# Patient Record
Sex: Male | Born: 1965
Health system: Southern US, Community
[De-identification: ages and names within clinical notes are randomized; demographics above are authoritative.]

---

## 2007-10-25 ENCOUNTER — Ambulatory Visit: Payer: Self-pay | Admitting: Internal Medicine

## 2007-11-20 ENCOUNTER — Ambulatory Visit: Payer: Self-pay | Admitting: Internal Medicine

## 2008-05-04 ENCOUNTER — Ambulatory Visit: Payer: Self-pay | Admitting: Family Medicine

## 2009-05-22 ENCOUNTER — Other Ambulatory Visit: Payer: Self-pay | Admitting: Internal Medicine

## 2010-04-28 ENCOUNTER — Ambulatory Visit: Payer: Self-pay | Admitting: Internal Medicine

## 2010-10-19 ENCOUNTER — Ambulatory Visit
Admission: RE | Admit: 2010-10-19 | Discharge: 2010-10-19 | Disposition: A | Discharge status: Home / Self Care | Payer: BC Managed Care – PPO | Source: Ambulatory Visit | Attending: Internal Medicine | Admitting: Internal Medicine

## 2010-10-19 ENCOUNTER — Other Ambulatory Visit: Payer: Self-pay | Admitting: Internal Medicine

## 2010-10-19 DIAGNOSIS — M542 Cervicalgia: Secondary | ICD-10-CM

## 2010-10-19 DIAGNOSIS — M25519 Pain in unspecified shoulder: Secondary | ICD-10-CM

## 2012-06-14 IMAGING — CR DG SHOULDER 2+V*R*
3 series · 3 of 3 positions shown · non-contrast
Comparison: None.

CLINICAL DATA: Chronic right shoulder pain.  No known injuries.

RIGHT SHOULDER - 2+ VIEW 10/19/2010:

[view not recorded (1 of 3)]
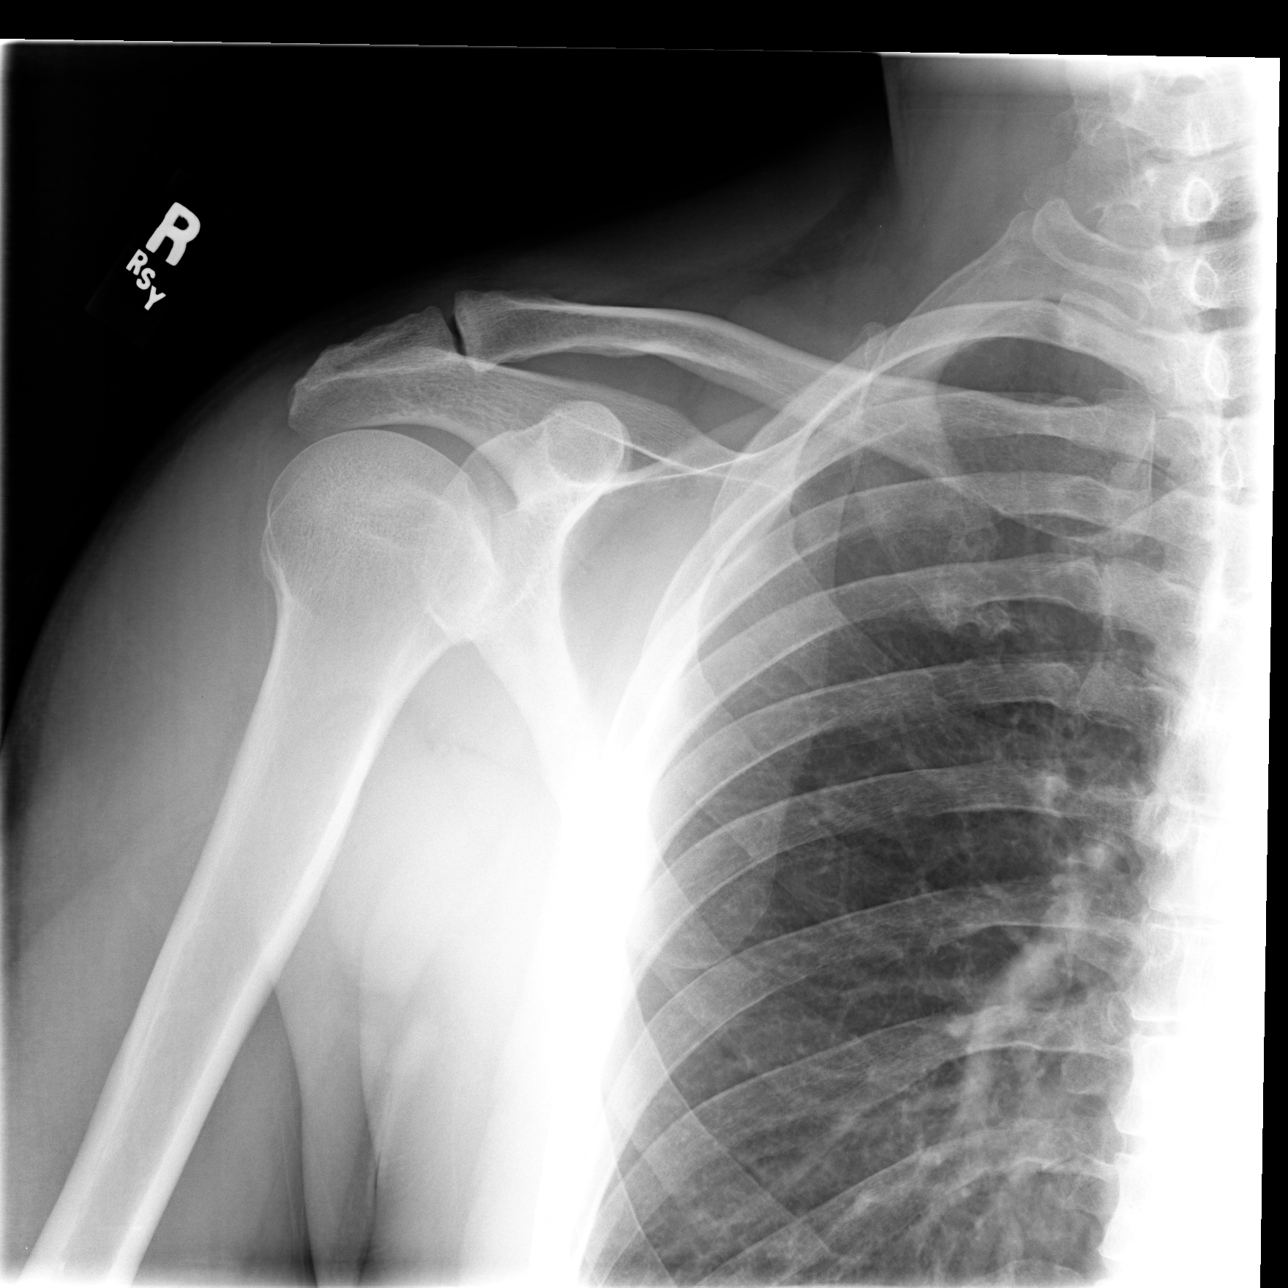

[view not recorded (2 of 3)]
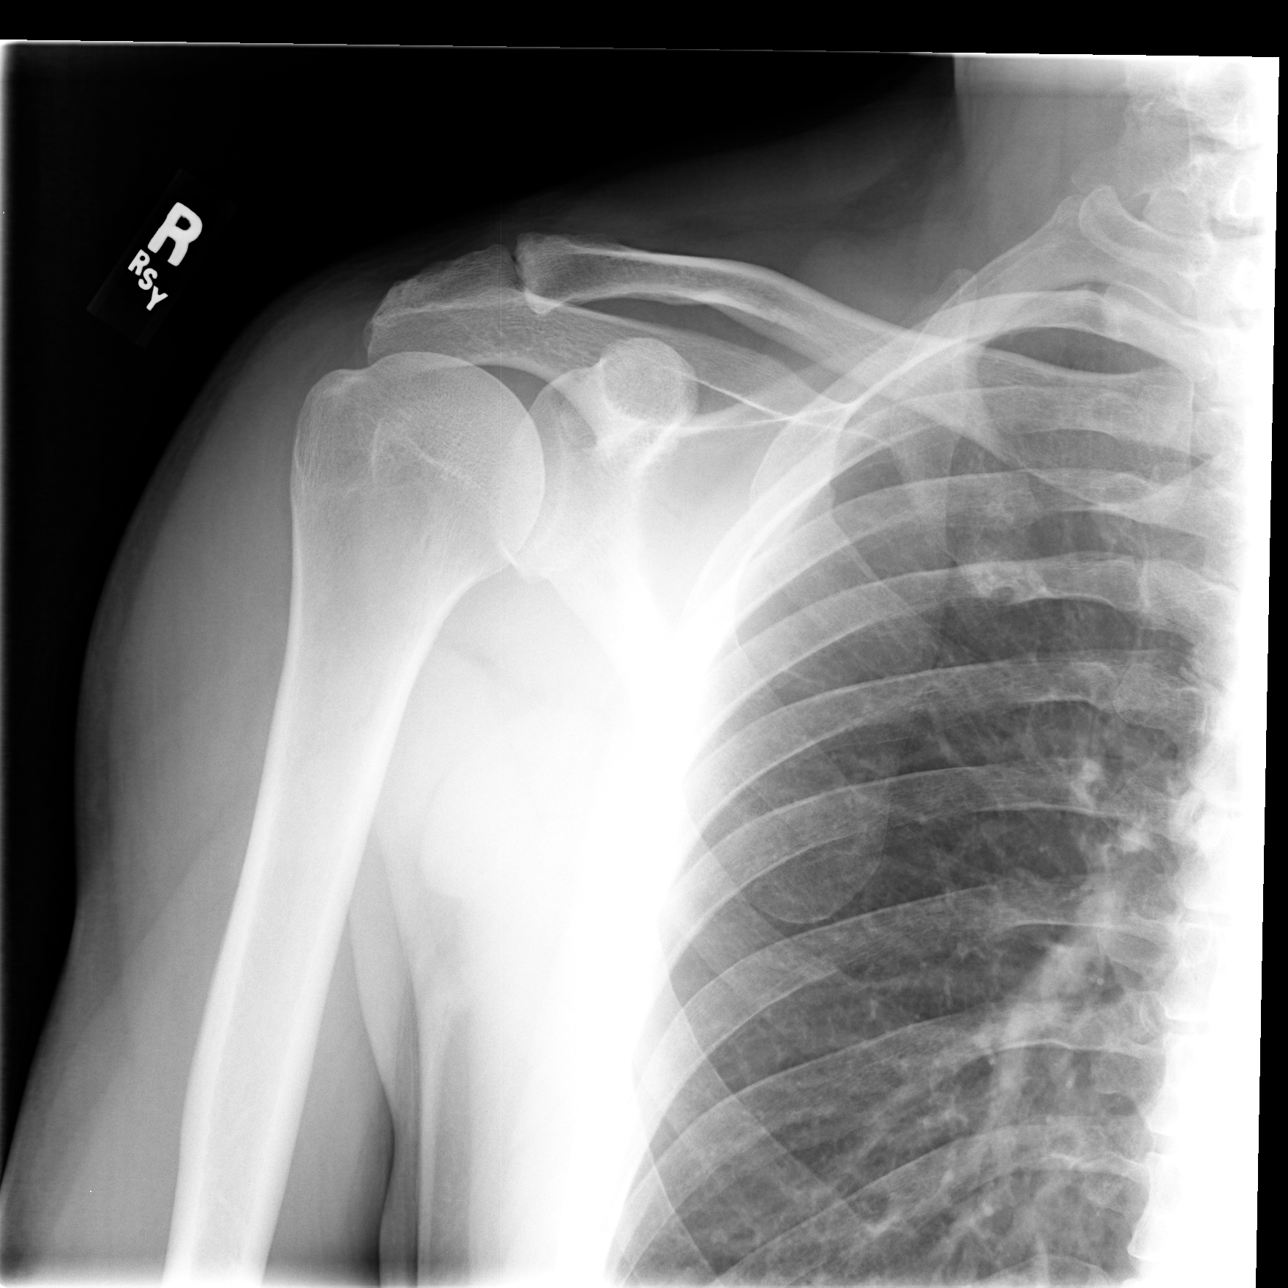

[view not recorded (3 of 3)]
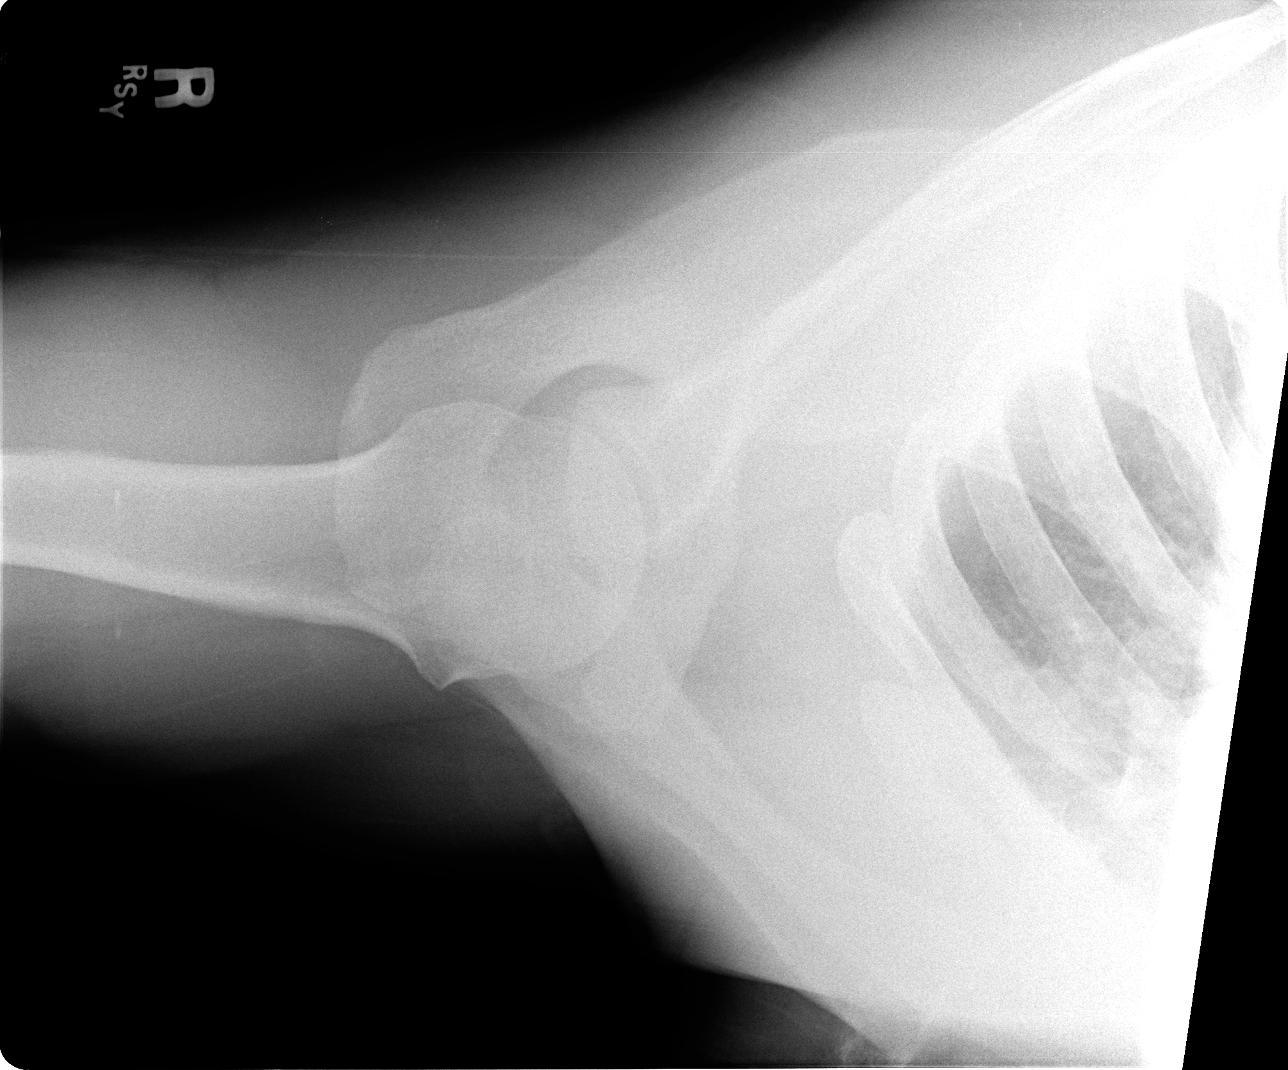

[3 of 3 positions shown; findings below may reference images not displayed]

FINDINGS: No evidence of acute, subacute, or healed fractures.
Glenohumeral joint intact.  Subacromial space well preserved.  Mild
degenerative changes in the acromioclavicular joint.  Well-
preserved bone mineral density.  No intrinsic osseous
abnormalities.
IMPRESSION: Mild degenerative changes in the acromioclavicular joint.
Otherwise normal examination.

## 2012-10-01 ENCOUNTER — Ambulatory Visit: Payer: Self-pay

## 2016-03-07 DIAGNOSIS — T387X5A Adverse effect of androgens and anabolic congeners, initial encounter: Secondary | ICD-10-CM | POA: Diagnosis not present

## 2016-03-07 DIAGNOSIS — N401 Enlarged prostate with lower urinary tract symptoms: Secondary | ICD-10-CM | POA: Diagnosis not present

## 2016-03-07 DIAGNOSIS — N138 Other obstructive and reflux uropathy: Secondary | ICD-10-CM | POA: Diagnosis not present

## 2016-03-07 DIAGNOSIS — R358 Other polyuria: Secondary | ICD-10-CM | POA: Diagnosis not present

## 2016-03-07 DIAGNOSIS — E291 Testicular hypofunction: Secondary | ICD-10-CM | POA: Diagnosis not present

## 2016-03-08 DIAGNOSIS — E291 Testicular hypofunction: Secondary | ICD-10-CM | POA: Diagnosis not present

## 2016-03-14 DIAGNOSIS — E291 Testicular hypofunction: Secondary | ICD-10-CM | POA: Diagnosis not present

## 2016-03-31 DIAGNOSIS — E291 Testicular hypofunction: Secondary | ICD-10-CM | POA: Diagnosis not present

## 2016-06-14 DIAGNOSIS — E291 Testicular hypofunction: Secondary | ICD-10-CM | POA: Diagnosis not present

## 2016-06-14 DIAGNOSIS — R358 Other polyuria: Secondary | ICD-10-CM | POA: Diagnosis not present

## 2016-07-26 DIAGNOSIS — R358 Other polyuria: Secondary | ICD-10-CM | POA: Diagnosis not present

## 2016-07-26 DIAGNOSIS — N401 Enlarged prostate with lower urinary tract symptoms: Secondary | ICD-10-CM | POA: Diagnosis not present

## 2016-07-26 DIAGNOSIS — E291 Testicular hypofunction: Secondary | ICD-10-CM | POA: Diagnosis not present

## 2016-07-26 DIAGNOSIS — T387X5D Adverse effect of androgens and anabolic congeners, subsequent encounter: Secondary | ICD-10-CM | POA: Diagnosis not present

## 2016-07-26 DIAGNOSIS — N138 Other obstructive and reflux uropathy: Secondary | ICD-10-CM | POA: Diagnosis not present

## 2016-10-14 DIAGNOSIS — M25561 Pain in right knee: Secondary | ICD-10-CM | POA: Diagnosis not present

## 2016-10-14 DIAGNOSIS — S8991XA Unspecified injury of right lower leg, initial encounter: Secondary | ICD-10-CM | POA: Diagnosis not present

## 2016-10-14 DIAGNOSIS — Z87891 Personal history of nicotine dependence: Secondary | ICD-10-CM | POA: Diagnosis not present

## 2016-10-14 DIAGNOSIS — R269 Unspecified abnormalities of gait and mobility: Secondary | ICD-10-CM | POA: Diagnosis not present

## 2016-10-14 DIAGNOSIS — X501XXA Overexertion from prolonged static or awkward postures, initial encounter: Secondary | ICD-10-CM | POA: Diagnosis not present

## 2016-10-14 DIAGNOSIS — Z6830 Body mass index (BMI) 30.0-30.9, adult: Secondary | ICD-10-CM | POA: Diagnosis not present

## 2016-10-17 DIAGNOSIS — S83241A Other tear of medial meniscus, current injury, right knee, initial encounter: Secondary | ICD-10-CM | POA: Diagnosis not present

## 2016-10-17 DIAGNOSIS — Z6830 Body mass index (BMI) 30.0-30.9, adult: Secondary | ICD-10-CM | POA: Diagnosis not present

## 2016-10-27 DIAGNOSIS — N138 Other obstructive and reflux uropathy: Secondary | ICD-10-CM | POA: Diagnosis not present

## 2016-10-27 DIAGNOSIS — N401 Enlarged prostate with lower urinary tract symptoms: Secondary | ICD-10-CM | POA: Diagnosis not present

## 2017-01-26 DIAGNOSIS — N486 Induration penis plastica: Secondary | ICD-10-CM | POA: Diagnosis not present

## 2017-01-26 DIAGNOSIS — E291 Testicular hypofunction: Secondary | ICD-10-CM | POA: Diagnosis not present

## 2017-01-26 DIAGNOSIS — Z1331 Encounter for screening for depression: Secondary | ICD-10-CM | POA: Diagnosis not present

## 2017-01-26 DIAGNOSIS — Z0000 Encounter for general adult medical examination without abnormal findings: Secondary | ICD-10-CM | POA: Diagnosis not present

## 2017-01-27 DIAGNOSIS — E291 Testicular hypofunction: Secondary | ICD-10-CM | POA: Diagnosis not present

## 2017-01-27 DIAGNOSIS — Z0000 Encounter for general adult medical examination without abnormal findings: Secondary | ICD-10-CM | POA: Diagnosis not present

## 2017-02-09 DIAGNOSIS — E875 Hyperkalemia: Secondary | ICD-10-CM | POA: Diagnosis not present

## 2017-02-09 DIAGNOSIS — M67912 Unspecified disorder of synovium and tendon, left shoulder: Secondary | ICD-10-CM | POA: Diagnosis not present

## 2017-02-09 DIAGNOSIS — Z6829 Body mass index (BMI) 29.0-29.9, adult: Secondary | ICD-10-CM | POA: Diagnosis not present

## 2017-02-22 DIAGNOSIS — M542 Cervicalgia: Secondary | ICD-10-CM | POA: Diagnosis not present

## 2017-02-22 DIAGNOSIS — G5602 Carpal tunnel syndrome, left upper limb: Secondary | ICD-10-CM | POA: Diagnosis not present

## 2017-03-01 DIAGNOSIS — G5602 Carpal tunnel syndrome, left upper limb: Secondary | ICD-10-CM | POA: Diagnosis not present

## 2017-03-02 DIAGNOSIS — N486 Induration penis plastica: Secondary | ICD-10-CM | POA: Diagnosis not present

## 2017-03-02 DIAGNOSIS — N4889 Other specified disorders of penis: Secondary | ICD-10-CM | POA: Diagnosis not present

## 2017-03-08 DIAGNOSIS — G5602 Carpal tunnel syndrome, left upper limb: Secondary | ICD-10-CM | POA: Diagnosis not present

## 2017-05-01 DIAGNOSIS — Z6829 Body mass index (BMI) 29.0-29.9, adult: Secondary | ICD-10-CM | POA: Diagnosis not present

## 2017-05-01 DIAGNOSIS — R030 Elevated blood-pressure reading, without diagnosis of hypertension: Secondary | ICD-10-CM | POA: Diagnosis not present

## 2017-05-01 DIAGNOSIS — G5602 Carpal tunnel syndrome, left upper limb: Secondary | ICD-10-CM | POA: Diagnosis not present

## 2017-05-01 DIAGNOSIS — E291 Testicular hypofunction: Secondary | ICD-10-CM | POA: Diagnosis not present

## 2017-08-07 DIAGNOSIS — Z1211 Encounter for screening for malignant neoplasm of colon: Secondary | ICD-10-CM | POA: Diagnosis not present

## 2017-08-07 DIAGNOSIS — G4733 Obstructive sleep apnea (adult) (pediatric): Secondary | ICD-10-CM | POA: Diagnosis not present

## 2017-08-07 DIAGNOSIS — Z6829 Body mass index (BMI) 29.0-29.9, adult: Secondary | ICD-10-CM | POA: Diagnosis not present

## 2017-08-07 DIAGNOSIS — I10 Essential (primary) hypertension: Secondary | ICD-10-CM | POA: Diagnosis not present

## 2017-09-08 DIAGNOSIS — E291 Testicular hypofunction: Secondary | ICD-10-CM | POA: Diagnosis not present

## 2017-09-08 DIAGNOSIS — I10 Essential (primary) hypertension: Secondary | ICD-10-CM | POA: Diagnosis not present

## 2017-09-19 DIAGNOSIS — E291 Testicular hypofunction: Secondary | ICD-10-CM | POA: Diagnosis not present

## 2017-09-19 DIAGNOSIS — Z6829 Body mass index (BMI) 29.0-29.9, adult: Secondary | ICD-10-CM | POA: Diagnosis not present

## 2017-09-19 DIAGNOSIS — I10 Essential (primary) hypertension: Secondary | ICD-10-CM | POA: Diagnosis not present

## 2017-10-04 DIAGNOSIS — Z6830 Body mass index (BMI) 30.0-30.9, adult: Secondary | ICD-10-CM | POA: Diagnosis not present

## 2017-10-04 DIAGNOSIS — Z87891 Personal history of nicotine dependence: Secondary | ICD-10-CM | POA: Diagnosis not present

## 2017-10-04 DIAGNOSIS — N50819 Testicular pain, unspecified: Secondary | ICD-10-CM | POA: Diagnosis not present

## 2017-10-04 DIAGNOSIS — R1031 Right lower quadrant pain: Secondary | ICD-10-CM | POA: Diagnosis not present

## 2017-10-04 DIAGNOSIS — N50811 Right testicular pain: Secondary | ICD-10-CM | POA: Diagnosis not present

## 2017-10-04 DIAGNOSIS — I700 Atherosclerosis of aorta: Secondary | ICD-10-CM | POA: Diagnosis not present

## 2017-10-04 DIAGNOSIS — M47816 Spondylosis without myelopathy or radiculopathy, lumbar region: Secondary | ICD-10-CM | POA: Diagnosis not present

## 2017-11-03 DIAGNOSIS — Z1211 Encounter for screening for malignant neoplasm of colon: Secondary | ICD-10-CM | POA: Diagnosis not present

## 2017-11-06 DIAGNOSIS — Z6829 Body mass index (BMI) 29.0-29.9, adult: Secondary | ICD-10-CM | POA: Diagnosis not present

## 2017-11-06 DIAGNOSIS — R1031 Right lower quadrant pain: Secondary | ICD-10-CM | POA: Diagnosis not present

## 2017-12-19 DIAGNOSIS — E291 Testicular hypofunction: Secondary | ICD-10-CM | POA: Diagnosis not present

## 2017-12-19 DIAGNOSIS — I10 Essential (primary) hypertension: Secondary | ICD-10-CM | POA: Diagnosis not present

## 2017-12-19 DIAGNOSIS — Z79899 Other long term (current) drug therapy: Secondary | ICD-10-CM | POA: Diagnosis not present

## 2017-12-19 DIAGNOSIS — Z125 Encounter for screening for malignant neoplasm of prostate: Secondary | ICD-10-CM | POA: Diagnosis not present

## 2017-12-26 DIAGNOSIS — Z6830 Body mass index (BMI) 30.0-30.9, adult: Secondary | ICD-10-CM | POA: Diagnosis not present

## 2017-12-26 DIAGNOSIS — E291 Testicular hypofunction: Secondary | ICD-10-CM | POA: Diagnosis not present

## 2017-12-26 DIAGNOSIS — I10 Essential (primary) hypertension: Secondary | ICD-10-CM | POA: Diagnosis not present

## 2018-04-03 DIAGNOSIS — E291 Testicular hypofunction: Secondary | ICD-10-CM | POA: Diagnosis not present

## 2018-04-06 DIAGNOSIS — E291 Testicular hypofunction: Secondary | ICD-10-CM | POA: Diagnosis not present

## 2018-04-06 DIAGNOSIS — Z6829 Body mass index (BMI) 29.0-29.9, adult: Secondary | ICD-10-CM | POA: Diagnosis not present

## 2018-08-13 DIAGNOSIS — E291 Testicular hypofunction: Secondary | ICD-10-CM | POA: Diagnosis not present

## 2018-08-15 DIAGNOSIS — Z6830 Body mass index (BMI) 30.0-30.9, adult: Secondary | ICD-10-CM | POA: Diagnosis not present

## 2018-08-15 DIAGNOSIS — Z1331 Encounter for screening for depression: Secondary | ICD-10-CM | POA: Diagnosis not present

## 2018-08-15 DIAGNOSIS — E291 Testicular hypofunction: Secondary | ICD-10-CM | POA: Diagnosis not present

## 2018-08-15 DIAGNOSIS — I10 Essential (primary) hypertension: Secondary | ICD-10-CM | POA: Diagnosis not present

## 2018-12-17 DIAGNOSIS — Z6828 Body mass index (BMI) 28.0-28.9, adult: Secondary | ICD-10-CM | POA: Diagnosis not present

## 2018-12-17 DIAGNOSIS — Z0000 Encounter for general adult medical examination without abnormal findings: Secondary | ICD-10-CM | POA: Diagnosis not present

## 2018-12-17 DIAGNOSIS — E291 Testicular hypofunction: Secondary | ICD-10-CM | POA: Diagnosis not present

## 2018-12-17 DIAGNOSIS — Z125 Encounter for screening for malignant neoplasm of prostate: Secondary | ICD-10-CM | POA: Diagnosis not present

## 2019-01-01 DIAGNOSIS — L219 Seborrheic dermatitis, unspecified: Secondary | ICD-10-CM | POA: Diagnosis not present

## 2019-01-01 DIAGNOSIS — L570 Actinic keratosis: Secondary | ICD-10-CM | POA: Diagnosis not present

## 2019-01-01 DIAGNOSIS — L578 Other skin changes due to chronic exposure to nonionizing radiation: Secondary | ICD-10-CM | POA: Diagnosis not present

## 2019-06-18 DIAGNOSIS — E291 Testicular hypofunction: Secondary | ICD-10-CM | POA: Diagnosis not present

## 2019-06-18 DIAGNOSIS — I10 Essential (primary) hypertension: Secondary | ICD-10-CM | POA: Diagnosis not present

## 2019-06-18 DIAGNOSIS — Z1331 Encounter for screening for depression: Secondary | ICD-10-CM | POA: Diagnosis not present

## 2019-06-18 DIAGNOSIS — Z79899 Other long term (current) drug therapy: Secondary | ICD-10-CM | POA: Diagnosis not present

## 2019-07-29 DIAGNOSIS — Z79899 Other long term (current) drug therapy: Secondary | ICD-10-CM | POA: Diagnosis not present

## 2019-10-23 DIAGNOSIS — M4721 Other spondylosis with radiculopathy, occipito-atlanto-axial region: Secondary | ICD-10-CM | POA: Diagnosis not present

## 2019-10-23 DIAGNOSIS — M9904 Segmental and somatic dysfunction of sacral region: Secondary | ICD-10-CM | POA: Diagnosis not present

## 2019-10-23 DIAGNOSIS — M9903 Segmental and somatic dysfunction of lumbar region: Secondary | ICD-10-CM | POA: Diagnosis not present

## 2019-10-23 DIAGNOSIS — M9901 Segmental and somatic dysfunction of cervical region: Secondary | ICD-10-CM | POA: Diagnosis not present

## 2019-10-24 DIAGNOSIS — M9903 Segmental and somatic dysfunction of lumbar region: Secondary | ICD-10-CM | POA: Diagnosis not present

## 2019-10-24 DIAGNOSIS — M9904 Segmental and somatic dysfunction of sacral region: Secondary | ICD-10-CM | POA: Diagnosis not present

## 2019-10-24 DIAGNOSIS — M9901 Segmental and somatic dysfunction of cervical region: Secondary | ICD-10-CM | POA: Diagnosis not present

## 2019-10-24 DIAGNOSIS — M4721 Other spondylosis with radiculopathy, occipito-atlanto-axial region: Secondary | ICD-10-CM | POA: Diagnosis not present

## 2019-11-29 ENCOUNTER — Other Ambulatory Visit: Payer: Self-pay

## 2019-11-29 ENCOUNTER — Ambulatory Visit
Admission: EM | Admit: 2019-11-29 | Discharge: 2019-11-29 | Disposition: A | Discharge status: Home / Self Care | Payer: BC Managed Care – PPO

## 2019-11-29 DIAGNOSIS — R519 Headache, unspecified: Secondary | ICD-10-CM | POA: Diagnosis not present

## 2019-11-29 DIAGNOSIS — R030 Elevated blood-pressure reading, without diagnosis of hypertension: Secondary | ICD-10-CM

## 2019-11-29 DIAGNOSIS — R000 Tachycardia, unspecified: Secondary | ICD-10-CM

## 2019-11-29 MED ORDER — KETOROLAC TROMETHAMINE 15 MG/ML IJ SOLN
15.0000 mg | Freq: Once | INTRAMUSCULAR | Status: AC
  Administered 2019-11-29: 15 mg via INTRAMUSCULAR

## 2019-11-29 MED ORDER — DEXAMETHASONE SODIUM PHOSPHATE 10 MG/ML IJ SOLN: 10.0000 mg | Freq: Once | INTRAMUSCULAR | Status: DC

## 2019-11-29 NOTE — ED Triage Notes (Signed)
Pt c/o frontal HA and eye pressure for approx 1 week and high BP of 168/106 yesterday, 172/96 this morning with 145/100 just PTA today. Also reports intermittent left sternal CP (tightness) and radiating to left jaw for "seconds at a time". Denies CP currently.  Denies changes in vision, slurred speech, extremity weakness, changes in cognition, diaphoresis, nausea, radiating pain to back, dizziness, congestion, runny nose, ear pressure, or other c/o.  Pt reports that six weeks ago he quit smoking marijuana (was a daily user for past 38 years). States when he ceased using marijuana approx 10 years ago, he had elevated BP, but no CP. Was Rx HTN medications, but quit taking it and began smoking marijuana again.

## 2019-11-29 NOTE — Discharge Instructions (Addendum)
No alarming signs on exam. EKG without alarming signs. Your current symptoms could be reactive to stopping smoking, especially with heart rate slightly elevated. Toradol injection in office today for headache. Keep hydrated, urine should be clear to pale yellow in color. Follow up with PCP as scheduled for reevaluation and management needed.

## 2019-11-29 NOTE — ED Provider Notes (Signed)
[facility]    CSN: [id number] Arrival date & time: 11/29/19  1239      History   Chief Complaint Chief Complaint  Patient presents with  . Headache    HPI Nicolas Fuller is a 54 y.o. male.   54 year old male comes in for 1 week history of frontal headache with eye pressure, increased BP readings. States he stopped THC use 6 weeks ago after being a daily user for the past 38 years. States has done this once in the past, and at the time had elevated BP and was put on medications. However, once he restarted THC use, elevated BP readings resolved. States for the past week, has had frontal headache without associated vision changes, slurred speech, one sided weakness, dizziness. Has tried ibuprofen/tylenol without relief. Today, experienced 1 episode of left sternal chest tightness that lasted for seconds. States it radiated to left jaw and resolved on own. Denies associated shortness of breath, diaphoresis, nausea/vomiting. Denies URI symptoms, fever. Denies exertional chest pain, exertional fatigue, dyspnea on exertion.      History reviewed. No pertinent past medical history.  There are no problems to display for this patient.   History reviewed. No pertinent surgical history.     Home Medications    Prior to Admission medications   Medication Sig Start Date End Date Taking? Authorizing Provider  testosterone cypionate (DEPOTESTOSTERONE CYPIONATE) 200 MG/ML injection INJECT 1 ML INTO DEEP IM EVERY 14 DAYS 10/21/14  Yes [provider]    Family History Family History  Problem Relation Age of Onset  . Healthy Mother   . Hypertension Father     Social History Social History   Tobacco Use  . Smoking status: Former Games developer  . Smokeless tobacco: Never Used  Vaping Use  . Vaping Use: Never used  Substance Use Topics  . Alcohol use: Never  . Drug use: Not Currently     Allergies   Patient has no known allergies.   Review of  Systems Review of Systems  Reason unable to perform ROS: See HPI as above.     Physical Exam Triage Vital Signs ED Triage Vitals  Enc Vitals Group     BP 11/29/19 1457 (!) 165/96     Pulse Rate 11/29/19 1457 (!) 118     Resp 11/29/19 1457 18     Temp 11/29/19 1457 98.6 F (37 C)     Temp Source 11/29/19 1457 Oral     SpO2 11/29/19 1457 96 %     Weight --      Height --      Head Circumference --      Peak Flow --      Pain Score 11/29/19 1518 7     Pain Loc --      Pain Edu? --      Excl. in GC? --    No data found.  Updated Vital Signs BP (!) 142/84 (BP Location: Left Arm)   Pulse (!) 107   Temp 98.6 F (37 C) (Oral)   Resp 18   SpO2 96%   Physical Exam Constitutional:      General: He is not in acute distress.    Appearance: Normal appearance. He is well-developed. He is not toxic-appearing or diaphoretic.  HENT:     Head: Normocephalic and atraumatic.     Right Ear: Tympanic membrane, ear canal and external ear normal.     Left Ear: Tympanic membrane, ear canal and  external ear normal.     Nose:     Right Sinus: No maxillary sinus tenderness or frontal sinus tenderness.     Left Sinus: No maxillary sinus tenderness or frontal sinus tenderness.     Mouth/Throat:     Mouth: Mucous membranes are moist.     Pharynx: Oropharynx is clear. Uvula midline.  Eyes:     Extraocular Movements: Extraocular movements intact.     Conjunctiva/sclera: Conjunctivae normal.     Pupils: Pupils are equal, round, and reactive to light.  Cardiovascular:     Rate and Rhythm: Regular rhythm. Tachycardia present.  Pulmonary:     Effort: Pulmonary effort is normal. No respiratory distress.     Comments: LCTAB Musculoskeletal:     Cervical back: Normal range of motion and neck supple.  Skin:    General: Skin is warm and dry.  Neurological:     Mental Status: He is alert and oriented to person, place, and time.     Comments: Grossly intact without focal deficits. Strength 5/5.  Sensation intact. Able to ambulate on own without difficulty.       UC Treatments / Results  Labs (all labs ordered are listed, but only abnormal results are displayed) Labs Reviewed - No data to display  EKG   Radiology No results found.  Procedures Procedures (including critical care time)  Medications Ordered in UC Medications  ketorolac (TORADOL) 15 MG/ML injection 15 mg (15 mg Intramuscular Given 11/29/19 1559)    Initial Impression / Assessment and Plan / UC Course  I have reviewed the triage vital signs and the nursing notes.  Pertinent labs & imaging results that were available during my care of the patient were reviewed by me and considered in my medical decision making (see chart for details).    EKG sinus tachycardia, 111bpm, no significant ST changes, no prior EKG for comparison. Patient with grossly intact neurology exam. Able to ambulate on own. Resolved chest pain without exertional symptoms. Discussed current symptoms likely reactive to sudden cessation of THC use. Will treat headache symptomatically with toradol. Otherwise to continue to monitor BP and document readings. Follow up with PCP as scheduled for reevaluation. Return precautions given.  Final Clinical Impressions(s) / UC Diagnoses   Final diagnoses:  Frontal headache  Tachycardia  Elevated blood pressure reading    ED Prescriptions    None     PDMP not reviewed this encounter.   Belinda Fisher, PA-C 11/29/19 1601

## 2020-01-03 DIAGNOSIS — R0602 Shortness of breath: Secondary | ICD-10-CM | POA: Diagnosis not present

## 2020-01-03 DIAGNOSIS — U071 COVID-19: Secondary | ICD-10-CM | POA: Diagnosis not present

## 2020-01-27 DIAGNOSIS — Z6830 Body mass index (BMI) 30.0-30.9, adult: Secondary | ICD-10-CM | POA: Diagnosis not present

## 2020-01-27 DIAGNOSIS — Z125 Encounter for screening for malignant neoplasm of prostate: Secondary | ICD-10-CM | POA: Diagnosis not present

## 2020-01-27 DIAGNOSIS — Z0000 Encounter for general adult medical examination without abnormal findings: Secondary | ICD-10-CM | POA: Diagnosis not present

## 2020-01-27 DIAGNOSIS — E291 Testicular hypofunction: Secondary | ICD-10-CM | POA: Diagnosis not present

## 2020-02-24 DIAGNOSIS — I10 Essential (primary) hypertension: Secondary | ICD-10-CM | POA: Diagnosis not present

## 2020-02-24 DIAGNOSIS — Z6829 Body mass index (BMI) 29.0-29.9, adult: Secondary | ICD-10-CM | POA: Diagnosis not present

## 2020-02-24 DIAGNOSIS — R351 Nocturia: Secondary | ICD-10-CM | POA: Diagnosis not present

## 2020-02-24 DIAGNOSIS — R600 Localized edema: Secondary | ICD-10-CM | POA: Diagnosis not present

## 2020-03-02 DIAGNOSIS — I10 Essential (primary) hypertension: Secondary | ICD-10-CM | POA: Diagnosis not present

## 2020-03-02 DIAGNOSIS — Z6830 Body mass index (BMI) 30.0-30.9, adult: Secondary | ICD-10-CM | POA: Diagnosis not present

## 2020-09-08 DIAGNOSIS — I10 Essential (primary) hypertension: Secondary | ICD-10-CM | POA: Diagnosis not present

## 2020-09-08 DIAGNOSIS — Z1331 Encounter for screening for depression: Secondary | ICD-10-CM | POA: Diagnosis not present

## 2020-09-08 DIAGNOSIS — Z79899 Other long term (current) drug therapy: Secondary | ICD-10-CM | POA: Diagnosis not present

## 2020-09-08 DIAGNOSIS — E291 Testicular hypofunction: Secondary | ICD-10-CM | POA: Diagnosis not present

## 2020-10-12 DIAGNOSIS — M25512 Pain in left shoulder: Secondary | ICD-10-CM | POA: Diagnosis not present

## 2020-10-12 DIAGNOSIS — M75102 Unspecified rotator cuff tear or rupture of left shoulder, not specified as traumatic: Secondary | ICD-10-CM | POA: Diagnosis not present

## 2020-10-12 DIAGNOSIS — M47812 Spondylosis without myelopathy or radiculopathy, cervical region: Secondary | ICD-10-CM | POA: Diagnosis not present

## 2020-10-14 DIAGNOSIS — R7401 Elevation of levels of liver transaminase levels: Secondary | ICD-10-CM | POA: Diagnosis not present

## 2020-10-15 DIAGNOSIS — M542 Cervicalgia: Secondary | ICD-10-CM | POA: Diagnosis not present

## 2020-10-15 DIAGNOSIS — M25512 Pain in left shoulder: Secondary | ICD-10-CM | POA: Diagnosis not present

## 2020-10-22 DIAGNOSIS — M542 Cervicalgia: Secondary | ICD-10-CM | POA: Diagnosis not present

## 2020-10-22 DIAGNOSIS — M25512 Pain in left shoulder: Secondary | ICD-10-CM | POA: Diagnosis not present

## 2020-11-17 DIAGNOSIS — E291 Testicular hypofunction: Secondary | ICD-10-CM | POA: Diagnosis not present

## 2020-11-17 DIAGNOSIS — D751 Secondary polycythemia: Secondary | ICD-10-CM | POA: Diagnosis not present

## 2020-11-17 DIAGNOSIS — R7401 Elevation of levels of liver transaminase levels: Secondary | ICD-10-CM | POA: Diagnosis not present

## 2021-01-29 DIAGNOSIS — Z1329 Encounter for screening for other suspected endocrine disorder: Secondary | ICD-10-CM | POA: Diagnosis not present

## 2021-01-29 DIAGNOSIS — G4730 Sleep apnea, unspecified: Secondary | ICD-10-CM | POA: Diagnosis not present

## 2021-01-29 DIAGNOSIS — R5383 Other fatigue: Secondary | ICD-10-CM | POA: Diagnosis not present

## 2021-01-29 DIAGNOSIS — Z13220 Encounter for screening for lipoid disorders: Secondary | ICD-10-CM | POA: Diagnosis not present

## 2021-01-29 DIAGNOSIS — E538 Deficiency of other specified B group vitamins: Secondary | ICD-10-CM | POA: Diagnosis not present

## 2021-01-29 DIAGNOSIS — E559 Vitamin D deficiency, unspecified: Secondary | ICD-10-CM | POA: Diagnosis not present

## 2021-01-29 DIAGNOSIS — N529 Male erectile dysfunction, unspecified: Secondary | ICD-10-CM | POA: Diagnosis not present

## 2021-01-29 DIAGNOSIS — M2550 Pain in unspecified joint: Secondary | ICD-10-CM | POA: Diagnosis not present

## 2021-01-29 DIAGNOSIS — E291 Testicular hypofunction: Secondary | ICD-10-CM | POA: Diagnosis not present

## 2021-02-03 DIAGNOSIS — L720 Epidermal cyst: Secondary | ICD-10-CM | POA: Diagnosis not present

## 2021-02-03 DIAGNOSIS — L570 Actinic keratosis: Secondary | ICD-10-CM | POA: Diagnosis not present

## 2021-02-17 DIAGNOSIS — L720 Epidermal cyst: Secondary | ICD-10-CM | POA: Diagnosis not present

## 2021-02-19 DIAGNOSIS — E291 Testicular hypofunction: Secondary | ICD-10-CM | POA: Diagnosis not present

## 2021-02-19 DIAGNOSIS — I10 Essential (primary) hypertension: Secondary | ICD-10-CM | POA: Diagnosis not present

## 2021-02-19 DIAGNOSIS — Z125 Encounter for screening for malignant neoplasm of prostate: Secondary | ICD-10-CM | POA: Diagnosis not present

## 2021-02-19 DIAGNOSIS — Z23 Encounter for immunization: Secondary | ICD-10-CM | POA: Diagnosis not present

## 2021-02-19 DIAGNOSIS — Z0000 Encounter for general adult medical examination without abnormal findings: Secondary | ICD-10-CM | POA: Diagnosis not present

## 2021-02-22 DIAGNOSIS — M75102 Unspecified rotator cuff tear or rupture of left shoulder, not specified as traumatic: Secondary | ICD-10-CM | POA: Diagnosis not present

## 2021-02-22 DIAGNOSIS — M47812 Spondylosis without myelopathy or radiculopathy, cervical region: Secondary | ICD-10-CM | POA: Diagnosis not present

## 2021-02-27 DIAGNOSIS — M47812 Spondylosis without myelopathy or radiculopathy, cervical region: Secondary | ICD-10-CM | POA: Diagnosis not present

## 2021-02-27 DIAGNOSIS — M75102 Unspecified rotator cuff tear or rupture of left shoulder, not specified as traumatic: Secondary | ICD-10-CM | POA: Diagnosis not present

## 2021-03-02 DIAGNOSIS — M47812 Spondylosis without myelopathy or radiculopathy, cervical region: Secondary | ICD-10-CM | POA: Diagnosis not present

## 2021-03-02 DIAGNOSIS — M5412 Radiculopathy, cervical region: Secondary | ICD-10-CM | POA: Diagnosis not present

## 2021-03-03 DIAGNOSIS — M75112 Incomplete rotator cuff tear or rupture of left shoulder, not specified as traumatic: Secondary | ICD-10-CM | POA: Diagnosis not present

## 2021-03-31 DIAGNOSIS — G4730 Sleep apnea, unspecified: Secondary | ICD-10-CM | POA: Diagnosis not present

## 2021-03-31 DIAGNOSIS — U099 Post covid-19 condition, unspecified: Secondary | ICD-10-CM | POA: Diagnosis not present

## 2021-03-31 DIAGNOSIS — E291 Testicular hypofunction: Secondary | ICD-10-CM | POA: Diagnosis not present

## 2021-03-31 DIAGNOSIS — M2550 Pain in unspecified joint: Secondary | ICD-10-CM | POA: Diagnosis not present

## 2021-04-05 DIAGNOSIS — M5412 Radiculopathy, cervical region: Secondary | ICD-10-CM | POA: Diagnosis not present

## 2021-04-20 DIAGNOSIS — M5412 Radiculopathy, cervical region: Secondary | ICD-10-CM | POA: Diagnosis not present

## 2021-04-26 DIAGNOSIS — M75110 Incomplete rotator cuff tear or rupture of unspecified shoulder, not specified as traumatic: Secondary | ICD-10-CM | POA: Diagnosis not present

## 2021-06-29 DIAGNOSIS — E291 Testicular hypofunction: Secondary | ICD-10-CM | POA: Diagnosis not present

## 2021-06-29 DIAGNOSIS — Z6831 Body mass index (BMI) 31.0-31.9, adult: Secondary | ICD-10-CM | POA: Diagnosis not present

## 2021-06-29 DIAGNOSIS — Z79899 Other long term (current) drug therapy: Secondary | ICD-10-CM | POA: Diagnosis not present

## 2021-06-29 DIAGNOSIS — I10 Essential (primary) hypertension: Secondary | ICD-10-CM | POA: Diagnosis not present

## 2021-08-06 DIAGNOSIS — I10 Essential (primary) hypertension: Secondary | ICD-10-CM | POA: Diagnosis not present

## 2021-08-06 DIAGNOSIS — R0989 Other specified symptoms and signs involving the circulatory and respiratory systems: Secondary | ICD-10-CM | POA: Diagnosis not present

## 2021-08-06 DIAGNOSIS — K219 Gastro-esophageal reflux disease without esophagitis: Secondary | ICD-10-CM | POA: Diagnosis not present

## 2022-02-11 DIAGNOSIS — Z125 Encounter for screening for malignant neoplasm of prostate: Secondary | ICD-10-CM | POA: Diagnosis not present

## 2022-02-11 DIAGNOSIS — Z1331 Encounter for screening for depression: Secondary | ICD-10-CM | POA: Diagnosis not present

## 2022-02-11 DIAGNOSIS — Z79899 Other long term (current) drug therapy: Secondary | ICD-10-CM | POA: Diagnosis not present

## 2022-02-11 DIAGNOSIS — E291 Testicular hypofunction: Secondary | ICD-10-CM | POA: Diagnosis not present

## 2022-02-11 DIAGNOSIS — I10 Essential (primary) hypertension: Secondary | ICD-10-CM | POA: Diagnosis not present

## 2022-06-30 DIAGNOSIS — G4700 Insomnia, unspecified: Secondary | ICD-10-CM | POA: Diagnosis not present

## 2022-08-18 DIAGNOSIS — R319 Hematuria, unspecified: Secondary | ICD-10-CM | POA: Diagnosis not present

## 2022-08-18 DIAGNOSIS — R079 Chest pain, unspecified: Secondary | ICD-10-CM | POA: Diagnosis not present

## 2022-08-18 DIAGNOSIS — I10 Essential (primary) hypertension: Secondary | ICD-10-CM | POA: Diagnosis not present

## 2022-08-18 DIAGNOSIS — E291 Testicular hypofunction: Secondary | ICD-10-CM | POA: Diagnosis not present

## 2022-08-18 DIAGNOSIS — Z79899 Other long term (current) drug therapy: Secondary | ICD-10-CM | POA: Diagnosis not present

## 2022-08-25 ENCOUNTER — Ambulatory Visit: Payer: BC Managed Care – PPO | Attending: Internal Medicine | Admitting: Internal Medicine

## 2022-08-25 ENCOUNTER — Encounter: Payer: Self-pay | Admitting: Internal Medicine

## 2022-08-25 VITALS — BP 132/86 | HR 100 | Ht 74.0 in | Wt 251.8 lb

## 2022-08-25 DIAGNOSIS — R079 Chest pain, unspecified: Secondary | ICD-10-CM | POA: Diagnosis not present

## 2022-08-25 DIAGNOSIS — Z01810 Encounter for preprocedural cardiovascular examination: Secondary | ICD-10-CM

## 2022-08-25 LAB — CBC
Hematocrit: 51.8 % — ABNORMAL HIGH (ref 37.5–51.0)
Hemoglobin: 17.8 g/dL — ABNORMAL HIGH (ref 13.0–17.7)
MCH: 32.7 pg (ref 26.6–33.0)
MCHC: 34.4 g/dL (ref 31.5–35.7)
MCV: 95 fL (ref 79–97)
Platelets: 306 10*3/uL (ref 150–450)
RBC: 5.44 x10E6/uL (ref 4.14–5.80)
RDW: 14.1 % (ref 11.6–15.4)
WBC: 8.4 10*3/uL (ref 3.4–10.8)

## 2022-08-25 LAB — BASIC METABOLIC PANEL
BUN/Creatinine Ratio: 18 (ref 9–20)
BUN: 21 mg/dL (ref 6–24)
CO2: 25 mmol/L (ref 20–29)
Calcium: 9.6 mg/dL (ref 8.7–10.2)
Chloride: 103 mmol/L (ref 96–106)
Creatinine, Ser: 1.19 mg/dL (ref 0.76–1.27)
Glucose: 124 mg/dL — ABNORMAL HIGH (ref 70–99)
Potassium: 4.5 mmol/L (ref 3.5–5.2)
Sodium: 136 mmol/L (ref 134–144)
eGFR: 72 mL/min/{1.73_m2} (ref >59)

## 2022-08-25 MED ORDER — NITROGLYCERIN 0.4 MG SL SUBL: 0.4000 mg | SUBLINGUAL_TABLET | SUBLINGUAL | 3 refills | Status: AC | PRN

## 2022-08-25 NOTE — H&P (View-Only) (Signed)
 Cardiology Office Note   Date:  08/25/2022   ID:  Nicolas Fuller, DOB 01/01/1966, MRN [id number]  PCP:  Llamas, Jewel C, MD  Cardiologist:   Karston Hyland, MD   Patient referred for CP      History of Present Illness: Federick A Fuller is a 57 y.o. male with a history of HL,HTN, OSA (intoleant to CPAP)   carpal tunnel L peyronie dz,  Seen by PCP on 08/18/22 with CP   CP occurred Saturday night prior to visit  Woke him up   Severe L anterior to L arm.  Lasted about 20 to 30 min   Resolved   Had some intermitt earlier in week    B Blocker and ASA added      Saturday night  (5/18)  Went to bed 10 PM   Woke up at 1:30    At first tried to reposition  Tried to sit up Shifted for 5 min   Wouldn't go away   Sat up  Kept gettng worse   7/10  Woke wife up   Glass of water Sat in recliner    15 min it subsided      Next day was "sore" in chest   BP 140s/80s   p99  Seen that Thursday 08/18/22 by N Conroy PA   Referred to cardiology    Started ASA  Rx for metoprolol 25  he did not start Had been on losartan for 1 year  CT abdomen in 2019 showed aortic atherosclerosiis  Pt has had a sl discomfort in L chest sbefore that   No  problems swallowoing.  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   losartan (COZAAR) 50 MG tablet Take 50 mg by mouth daily.   metoprolol succinate (TOPROL-XL) 25 MG 24 hr tablet Take 25 mg by mouth daily.     Allergies:   Patient has no known allergies.   No past medical history on file.  No past surgical history on file.   Social History:  The patient  reports that he has quit smoking. He has never used smokeless tobacco. He reports that he does not currently use drugs. He reports that he does not drink alcohol.   Family History:  The patient's family history includes  Healthy in his mother; Hypertension in his father.    ROS:  Please see the history of present illness. All other systems are reviewed and  Negative to the above problem except as noted.    PHYSICAL EXAM: VS:  BP 132/86   Pulse 100   Ht 6' 2" (1.88 m)   Wt 251 lb 12.8 oz (114.2 kg)   SpO2 95%   BMI 32.33 kg/m   GEN: Well nourished, well developed, in no acute distress,  Does appear anxious at times  HEENT: normal  Neck: no JVD, no carotid bruits,  Cardiac: RRR; no murmur  No LE edema  Respiratory:  clear to auscultation  GI: soft, nontender, nondistended,No masses No hepatomegaly  MS: no deformity Moving all extremities   Skin: warm and dry, no rash Neuro:  Strength and sensation are intact Psych: euthymic mood, full affect   EKG:  EKG is ordered today.   NSR 95 bpm     Lipid Panel No results found for: "CHOL", "TRIG", "HDL", "CHOLHDL", "VLDL", "LDLCALC", "LDLDIRECT"    Wt Readings from Last 3 Encounters:  08/25/22 251 lb 12.8 oz (114.2 kg)      ASSESSMENT AND PLAN:  1  Chest pain.   Pt now with one severe spell that resolved on own   Concerning   has also noted episodes of mild chest pressure as well   Does note increase fatigue   He does admit to being under increased stress due to marriage problems   Symtpoms not like prior reflux I have reviewed options for evaluating with patinet   Could do CCTA or LHC.   Given his history and that fact that he has atherosclerosis noted on prior CT of abdomen, I would favor LHC    Risks/ benefits described.  Patient understands and agrees to proceed Continue ASA    Start metoprolol (HR is higher).  Rx given for NTG (SL) PRN IF he has a severe spell as he did the other day, take NTG and go to ER Plan for LHC tomorrow Labs today  2.  LIpids   WIll check Lipomed today  3  Metabolic  Check Hgb A1C     Current medicines are reviewed at length with the patient today.  The patient does not have concerns regarding  medicines.  Signed, Jermari Tamargo, MD  08/25/2022 3:10 PM    [facility] [address], [place], [place]  [address] Phone: [phone]; Fax: [phone]    

## 2022-08-25 NOTE — Patient Instructions (Addendum)
Medication Instructions:  NITROGLYCERIN TO BE USED AS NEEDED FOR CHEST DISCOMFORT  START METOPROLOL   *If you need a refill on your cardiac medications before your next appointment, please call your pharmacy*   Lab Work: BMET AND CBC STAT FOR CATH 08/26/22 NMR, LIPO A, HGBA1C TODAY ALSO   If you have labs (blood work) drawn today and your tests are completely normal, you will receive your results only by: MyChart Message (if you have MyChart) OR A paper copy in the mail If you have any lab test that is abnormal or we need to change your treatment, we will call you to review the results.   Testing/Procedures:   [facility] A DEPT OF [place] AT [facility] [address] [place], [place] [address] [id number] [facility] [place] [address] Dept: [phone] Loc: [phone]  GUNTER GOODNER  08/25/2022  You are scheduled for a Cardiac Catheterization on Friday, May 31 with Dr. Lance Muss.  1. Please arrive at the [facility] (Main Entrance A) at [facility]: [address] [place], [place] [address] at 10:00 AM (This time is 2 hour(s) before your procedure to ensure your preparation). Free valet parking service is available. You will check in at ADMITTING. The support person will be asked to wait in the waiting room.  It is OK to have someone drop you off and come back when you are ready to be discharged.    Special note: Every effort is made to have your procedure done on time. Please understand that emergencies sometimes delay scheduled procedures.  2. Diet: Do not eat solid foods after midnight.  The patient may have clear liquids until 5am upon the day of the procedure.  3. Labs: 08/25/22   4. Medication instructions in preparation for your procedure:   Contrast Allergy: No  On the morning of your procedure, take your Aspirin 81 mg and any morning medicines NOT listed above.  You may use sips of water.  5. Plan to  go home the same day, you will only stay overnight if medically necessary. 6. Bring a current list of your medications and current insurance cards. 7. You MUST have a responsible person to drive you home. 8. Someone MUST be with you the first 24 hours after you arrive home or your discharge will be delayed. 9. Please wear clothes that are easy to get on and off and wear slip-on shoes.  Thank you for allowing [place] to care for you!   -- [facility]     Follow-Up: At [facility], you and your health needs are our priority.  As part of our continuing mission to provide you with exceptional heart care, we have created designated Provider Care Teams.  These Care Teams include your primary Cardiologist (physician) and Advanced Practice Providers (APPs -  Physician Assistants and Nurse Practitioners) who all work together to provide you with the care you need, when you need it.  We recommend signing up for the patient portal called "MyChart".  Sign up information is provided on this After Visit Summary.  MyChart is used to connect with patients for Virtual Visits (Telemedicine).  Patients are able to view lab/test results, encounter notes, upcoming appointments, etc.  Non-urgent messages can be sent to your provider as well.   To learn more about what you can do with MyChart, go to [email or web address].

## 2022-08-25 NOTE — Progress Notes (Signed)
Cardiology Office Note   Date:  08/25/2022   ID:  [name], DOB 07-14-1965, MRN [id number]  PCP:  Nicolas Askew, MD  Cardiologist:   Nicolas Pates, MD   Patient referred for CP      History of Present Illness: Nicolas Fuller is a 58 y.o. male with a history of HL,HTN, OSA (intoleant to CPAP)   carpal tunnel L peyronie dz,  Seen by PCP on 08/18/22 with CP   CP occurred Saturday night prior to visit  Woke him up   Severe L anterior to L arm.  Lasted about 20 to 30 min   Resolved   Had some intermitt earlier in week    B Blocker and ASA added      Saturday night  (5/18)  Went to bed 10 PM   Woke up at 1:30    At first tried to reposition  Tried to sit up Shifted for 5 min   Wouldn't go away   Sat up  Kept gettng worse   7/10  Woke wife up   Glass of water Sat in recliner    15 min it subsided      Next day was "sore" in chest   BP 140s/80s   p99  Seen that Thursday 08/18/22 by Nicolas Bi PA   Referred to cardiology    Started ASA  Rx for metoprolol 25  he did not start Had been on losartan for 1 year  CT abdomen in 2019 showed aortic atherosclerosiis  Pt has had a sl discomfort in L chest sbefore that   No  problems swallowoing.  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   losartan (COZAAR) 50 MG tablet Take 50 mg by mouth daily.   metoprolol succinate (TOPROL-XL) 25 MG 24 hr tablet Take 25 mg by mouth daily.     Allergies:   Patient has no known allergies.   No past medical history on file.  No past surgical history on file.   Social History:  The patient  reports that he has quit smoking. He has never used smokeless tobacco. He reports that he does not currently use drugs. He reports that he does not drink alcohol.   Family History:  The patient's family history includes  Healthy in his mother; Hypertension in his father.    ROS:  Please see the history of present illness. All other systems are reviewed and  Negative to the above problem except as noted.    PHYSICAL EXAM: VS:  BP 132/86   Pulse 100   Ht 6\' 2"  (1.88 m)   Wt 251 lb 12.8 oz (114.2 kg)   SpO2 95%   BMI 32.33 kg/m   GEN: Well nourished, well developed, in no acute distress,  Does appear anxious at times  HEENT: normal  Neck: no JVD, no carotid bruits,  Cardiac: RRR; no murmur  No LE edema  Respiratory:  clear to auscultation  GI: soft, nontender, nondistended,No masses No hepatomegaly  MS: no deformity Moving all extremities   Skin: warm and dry, no rash Neuro:  Strength and sensation are intact Psych: euthymic mood, full affect   EKG:  EKG is ordered today.  NSR 95 bpm     Lipid Panel No results found for: "CHOL", "TRIG", "HDL", "CHOLHDL", "VLDL", "LDLCALC", "LDLDIRECT"    Wt Readings from Last 3 Encounters:  08/25/22 251 lb 12.8 oz (114.2 kg)      ASSESSMENT AND PLAN:  1  Chest pain.   Pt now with one severe spell that resolved on own   Concerning   has also noted episodes of mild chest pressure as well   Does note increase fatigue   He does admit to being under increased stress due to marriage problems   Symtpoms not like prior reflux I have reviewed options for evaluating with patinet   Could do CCTA or LHC.   Given his history and that fact that he has atherosclerosis noted on prior CT of abdomen, I would favor LHC    Risks/ benefits described.  Patient understands and agrees to proceed Continue ASA    Start metoprolol (HR is higher).  Rx given for NTG (SL) PRN IF he has a severe spell as he did the other day, take NTG and go to ER Plan for [facility] tomorrow Labs today  2.  LIpids   WIll check Lipomed today  3  Metabolic  Check Hgb A1C     Current medicines are reviewed at length with the patient today.  The patient does not have concerns regarding  medicines.  Signed, Nicolas Pates, MD  08/25/2022 3:10 PM    [facility] [address] [place], [place], [place]  [address] Phone: [phone]; Fax: [phone]

## 2022-08-26 ENCOUNTER — Ambulatory Visit (HOSPITAL_COMMUNITY)
Admission: RE | Admit: 2022-08-26 | Discharge: 2022-08-26 | Disposition: A | Discharge status: Home / Self Care | Payer: BC Managed Care – PPO | Attending: Interventional Cardiology | Admitting: Interventional Cardiology

## 2022-08-26 ENCOUNTER — Encounter (HOSPITAL_COMMUNITY)
Admission: RE | Disposition: A | Discharge status: Home / Self Care | Payer: Self-pay | Source: Home / Self Care | Attending: Interventional Cardiology

## 2022-08-26 ENCOUNTER — Other Ambulatory Visit: Payer: Self-pay

## 2022-08-26 DIAGNOSIS — I10 Essential (primary) hypertension: Secondary | ICD-10-CM | POA: Insufficient documentation

## 2022-08-26 DIAGNOSIS — E785 Hyperlipidemia, unspecified: Secondary | ICD-10-CM | POA: Diagnosis not present

## 2022-08-26 DIAGNOSIS — R072 Precordial pain: Secondary | ICD-10-CM | POA: Diagnosis not present

## 2022-08-26 DIAGNOSIS — Z79899 Other long term (current) drug therapy: Secondary | ICD-10-CM | POA: Diagnosis not present

## 2022-08-26 DIAGNOSIS — G4733 Obstructive sleep apnea (adult) (pediatric): Secondary | ICD-10-CM | POA: Insufficient documentation

## 2022-08-26 DIAGNOSIS — Z7982 Long term (current) use of aspirin: Secondary | ICD-10-CM | POA: Insufficient documentation

## 2022-08-26 DIAGNOSIS — Z87891 Personal history of nicotine dependence: Secondary | ICD-10-CM | POA: Diagnosis not present

## 2022-08-26 HISTORY — PX: LEFT HEART CATH AND CORONARY ANGIOGRAPHY: CATH118249

## 2022-08-26 SURGERY — LEFT HEART CATH AND CORONARY ANGIOGRAPHY
Anesthesia: LOCAL

## 2022-08-26 MED ORDER — LIDOCAINE HCL (PF) 1 % IJ SOLN
INTRAMUSCULAR | Status: AC
  Filled 2022-08-26: qty 30

## 2022-08-26 MED ORDER — SODIUM CHLORIDE 0.9 % WEIGHT BASED INFUSION: 1.0000 mL/kg/h | INTRAVENOUS | Status: DC

## 2022-08-26 MED ORDER — HYDRALAZINE HCL 20 MG/ML IJ SOLN: 10.0000 mg | INTRAMUSCULAR | Status: DC | PRN

## 2022-08-26 MED ORDER — FENTANYL CITRATE (PF) 100 MCG/2ML IJ SOLN
INTRAMUSCULAR | Status: AC
  Filled 2022-08-26: qty 2

## 2022-08-26 MED ORDER — SODIUM CHLORIDE 0.9% FLUSH: 3.0000 mL | Freq: Two times a day (BID) | INTRAVENOUS | Status: DC

## 2022-08-26 MED ORDER — VERAPAMIL HCL 2.5 MG/ML IV SOLN
INTRAVENOUS | Status: AC
  Filled 2022-08-26: qty 2

## 2022-08-26 MED ORDER — MIDAZOLAM HCL 2 MG/2ML IJ SOLN
INTRAMUSCULAR | Status: DC | PRN
  Administered 2022-08-26: 1 mg via INTRAVENOUS
  Administered 2022-08-26: 2 mg via INTRAVENOUS

## 2022-08-26 MED ORDER — HEPARIN SODIUM (PORCINE) 1000 UNIT/ML IJ SOLN
INTRAMUSCULAR | Status: DC | PRN
  Administered 2022-08-26: 5000 [IU] via INTRAVENOUS

## 2022-08-26 MED ORDER — SODIUM CHLORIDE 0.9 % IV SOLN: INTRAVENOUS | Status: DC

## 2022-08-26 MED ORDER — MIDAZOLAM HCL 2 MG/2ML IJ SOLN
INTRAMUSCULAR | Status: AC
  Filled 2022-08-26: qty 2

## 2022-08-26 MED ORDER — LIDOCAINE HCL (PF) 1 % IJ SOLN
INTRAMUSCULAR | Status: DC | PRN
  Administered 2022-08-26: 2 mL

## 2022-08-26 MED ORDER — SODIUM CHLORIDE 0.9 % IV SOLN: 250.0000 mL | INTRAVENOUS | Status: DC | PRN

## 2022-08-26 MED ORDER — ACETAMINOPHEN 325 MG PO TABS: 650.0000 mg | ORAL_TABLET | ORAL | Status: DC | PRN

## 2022-08-26 MED ORDER — HEPARIN SODIUM (PORCINE) 1000 UNIT/ML IJ SOLN
INTRAMUSCULAR | Status: AC
  Filled 2022-08-26: qty 10

## 2022-08-26 MED ORDER — FENTANYL CITRATE (PF) 100 MCG/2ML IJ SOLN
INTRAMUSCULAR | Status: DC | PRN
  Administered 2022-08-26 (×2): 25 ug via INTRAVENOUS

## 2022-08-26 MED ORDER — HEPARIN (PORCINE) IN NACL 1000-0.9 UT/500ML-% IV SOLN
INTRAVENOUS | Status: DC | PRN
  Administered 2022-08-26 (×2): 500 mL

## 2022-08-26 MED ORDER — ONDANSETRON HCL 4 MG/2ML IJ SOLN: 4.0000 mg | Freq: Four times a day (QID) | INTRAMUSCULAR | Status: DC | PRN

## 2022-08-26 MED ORDER — ASPIRIN 81 MG PO CHEW: 81.0000 mg | CHEWABLE_TABLET | ORAL | Status: DC

## 2022-08-26 MED ORDER — SODIUM CHLORIDE 0.9 % WEIGHT BASED INFUSION
3.0000 mL/kg/h | INTRAVENOUS | Status: AC
  Administered 2022-08-26: 3 mL/kg/h via INTRAVENOUS

## 2022-08-26 MED ORDER — IOHEXOL 350 MG/ML SOLN
INTRAVENOUS | Status: DC | PRN
  Administered 2022-08-26: 45 mL

## 2022-08-26 MED ORDER — SODIUM CHLORIDE 0.9% FLUSH: 3.0000 mL | INTRAVENOUS | Status: DC | PRN

## 2022-08-26 MED ORDER — LABETALOL HCL 5 MG/ML IV SOLN: 10.0000 mg | INTRAVENOUS | Status: DC | PRN

## 2022-08-26 MED ORDER — VERAPAMIL HCL 2.5 MG/ML IV SOLN
INTRAVENOUS | Status: DC | PRN
  Administered 2022-08-26: 10 mL via INTRA_ARTERIAL

## 2022-08-26 SURGICAL SUPPLY — 11 items
BAND ZEPHYR COMPRESS 30 LONG (HEMOSTASIS) IMPLANT
CATH 5FR JL3.5 JR4 ANG PIG MP (CATHETERS) IMPLANT
GLIDESHEATH SLEND SS 6F .021 (SHEATH) IMPLANT
GUIDEWIRE INQWIRE 1.5J.035X260 (WIRE) IMPLANT
INQWIRE 1.5J .035X260CM (WIRE) ×1
KIT HEART LEFT (KITS) ×1 IMPLANT
PACK CARDIAC CATHETERIZATION (CUSTOM PROCEDURE TRAY) ×1 IMPLANT
SHEATH PROBE COVER 6X72 (BAG) IMPLANT
TRANSDUCER W/STOPCOCK (MISCELLANEOUS) ×1 IMPLANT
TUBING CIL FLEX 10 FLL-RA (TUBING) ×1 IMPLANT
WIRE HI TORQ VERSACORE-J 145CM (WIRE) IMPLANT

## 2022-08-26 NOTE — Interval H&P Note (Signed)
Cath Lab Visit (complete for each Cath Lab visit)  Clinical Evaluation Leading to the Procedure:   ACS: Yes.    Non-ACS:    Anginal Classification: CCS IV  Anti-ischemic medical therapy: Minimal Therapy (1 class of medications)  Non-Invasive Test Results: No non-invasive testing performed  Prior CABG: No previous CABG      History and Physical Interval Note:  08/26/2022 11:24 AM  Nicolas Fuller  has presented today for surgery, with the diagnosis of chest pain.  The various methods of treatment have been discussed with the patient and family. After consideration of risks, benefits and other options for treatment, the patient has consented to  Procedure(s): LEFT HEART CATH AND CORONARY ANGIOGRAPHY (N/A) as a surgical intervention.  The patient's history has been reviewed, patient examined, no change in status, stable for surgery.  I have reviewed the patient's chart and labs.  Questions were answered to the patient's satisfaction.     Lance Muss

## 2022-08-29 ENCOUNTER — Encounter (HOSPITAL_COMMUNITY): Payer: Self-pay | Admitting: Interventional Cardiology

## 2022-08-29 ENCOUNTER — Telehealth: Payer: Self-pay

## 2022-08-29 NOTE — Telephone Encounter (Signed)
I advised the pt his lab results and tried to make him an appt to come back and see Dr Tenny Craw after his Cath and to go over his Lipids and he declined... he says he rather just keep up with one MD his PCP.Marland Kitchen Lonie Peak PA... he asked to have his labs sent to him... he reports that his Cath site has healed and he has not had any difficulty.   I will forward labs to his PCP per his request.

## 2022-08-29 NOTE — Telephone Encounter (Signed)
-----   Message from Pricilla Riffle, MD sent at 08/26/2022 11:00 AM EDT ----- Lipids are elevated   Will review with patient after cath

## 2022-08-30 LAB — HEMOGLOBIN A1C
Est. average glucose Bld gHb Est-mCnc: 120 mg/dL
Hgb A1c MFr Bld: 5.8 % — ABNORMAL HIGH (ref 4.8–5.6)

## 2022-08-30 LAB — NMR, LIPOPROFILE
Cholesterol, Total: 247 mg/dL — ABNORMAL HIGH (ref 100–199)
HDL Particle Number: 36.9 umol/L (ref >=30.5)
HDL-C: 53 mg/dL (ref >39)
LDL Particle Number: 1989 nmol/L — ABNORMAL HIGH (ref <1000)
LDL Size: 20.3 nm — ABNORMAL LOW (ref >20.5)
LDL-C (NIH Calc): 124 mg/dL — ABNORMAL HIGH (ref 0–99)
LP-IR Score: 83 — ABNORMAL HIGH (ref <=45)
Small LDL Particle Number: 1022 nmol/L — ABNORMAL HIGH (ref <=527)
Triglycerides: 394 mg/dL — ABNORMAL HIGH (ref 0–149)

## 2022-08-30 LAB — LIPOPROTEIN A (LPA): Lipoprotein (a): <8.4 nmol/L (ref <75.0)

## 2023-02-28 DIAGNOSIS — E785 Hyperlipidemia, unspecified: Secondary | ICD-10-CM | POA: Diagnosis not present

## 2023-02-28 DIAGNOSIS — E291 Testicular hypofunction: Secondary | ICD-10-CM | POA: Diagnosis not present

## 2023-02-28 DIAGNOSIS — Z79899 Other long term (current) drug therapy: Secondary | ICD-10-CM | POA: Diagnosis not present

## 2023-02-28 DIAGNOSIS — Z125 Encounter for screening for malignant neoplasm of prostate: Secondary | ICD-10-CM | POA: Diagnosis not present

## 2023-02-28 DIAGNOSIS — Z1331 Encounter for screening for depression: Secondary | ICD-10-CM | POA: Diagnosis not present

## 2023-02-28 DIAGNOSIS — I10 Essential (primary) hypertension: Secondary | ICD-10-CM | POA: Diagnosis not present

## 2023-05-10 DIAGNOSIS — F4323 Adjustment disorder with mixed anxiety and depressed mood: Secondary | ICD-10-CM | POA: Diagnosis not present

## 2023-05-24 DIAGNOSIS — F4323 Adjustment disorder with mixed anxiety and depressed mood: Secondary | ICD-10-CM | POA: Diagnosis not present

## 2023-06-07 DIAGNOSIS — F4323 Adjustment disorder with mixed anxiety and depressed mood: Secondary | ICD-10-CM | POA: Diagnosis not present

## 2023-06-21 DIAGNOSIS — F4323 Adjustment disorder with mixed anxiety and depressed mood: Secondary | ICD-10-CM | POA: Diagnosis not present

## 2023-08-02 DIAGNOSIS — F4323 Adjustment disorder with mixed anxiety and depressed mood: Secondary | ICD-10-CM | POA: Diagnosis not present

## 2023-08-29 DIAGNOSIS — Z131 Encounter for screening for diabetes mellitus: Secondary | ICD-10-CM | POA: Diagnosis not present

## 2023-08-29 DIAGNOSIS — E785 Hyperlipidemia, unspecified: Secondary | ICD-10-CM | POA: Diagnosis not present

## 2023-08-29 DIAGNOSIS — Z79899 Other long term (current) drug therapy: Secondary | ICD-10-CM | POA: Diagnosis not present

## 2023-08-29 DIAGNOSIS — I10 Essential (primary) hypertension: Secondary | ICD-10-CM | POA: Diagnosis not present

## 2023-08-29 DIAGNOSIS — E291 Testicular hypofunction: Secondary | ICD-10-CM | POA: Diagnosis not present

## 2023-10-12 ENCOUNTER — Emergency Department (HOSPITAL_COMMUNITY)

## 2023-10-12 ENCOUNTER — Emergency Department (HOSPITAL_COMMUNITY)
Admission: EM | Admit: 2023-10-12 | Discharge: 2023-10-13 | Disposition: A | Discharge status: Home / Self Care | Attending: Emergency Medicine | Admitting: Emergency Medicine

## 2023-10-12 DIAGNOSIS — F10120 Alcohol abuse with intoxication, uncomplicated: Secondary | ICD-10-CM | POA: Diagnosis not present

## 2023-10-12 DIAGNOSIS — R4182 Altered mental status, unspecified: Secondary | ICD-10-CM | POA: Diagnosis not present

## 2023-10-12 DIAGNOSIS — I10 Essential (primary) hypertension: Secondary | ICD-10-CM | POA: Diagnosis not present

## 2023-10-12 DIAGNOSIS — I959 Hypotension, unspecified: Secondary | ICD-10-CM | POA: Diagnosis not present

## 2023-10-12 DIAGNOSIS — F10129 Alcohol abuse with intoxication, unspecified: Secondary | ICD-10-CM | POA: Diagnosis not present

## 2023-10-12 DIAGNOSIS — M542 Cervicalgia: Secondary | ICD-10-CM | POA: Diagnosis not present

## 2023-10-12 DIAGNOSIS — R0902 Hypoxemia: Secondary | ICD-10-CM | POA: Diagnosis not present

## 2023-10-12 DIAGNOSIS — R0989 Other specified symptoms and signs involving the circulatory and respiratory systems: Secondary | ICD-10-CM | POA: Diagnosis not present

## 2023-10-12 DIAGNOSIS — F1410 Cocaine abuse, uncomplicated: Secondary | ICD-10-CM | POA: Insufficient documentation

## 2023-10-12 DIAGNOSIS — Z7982 Long term (current) use of aspirin: Secondary | ICD-10-CM | POA: Insufficient documentation

## 2023-10-12 DIAGNOSIS — G44309 Post-traumatic headache, unspecified, not intractable: Secondary | ICD-10-CM | POA: Diagnosis not present

## 2023-10-12 DIAGNOSIS — X30XXXA Exposure to excessive natural heat, initial encounter: Secondary | ICD-10-CM | POA: Diagnosis not present

## 2023-10-12 DIAGNOSIS — F10920 Alcohol use, unspecified with intoxication, uncomplicated: Secondary | ICD-10-CM

## 2023-10-12 DIAGNOSIS — J9811 Atelectasis: Secondary | ICD-10-CM | POA: Diagnosis not present

## 2023-10-12 MED ORDER — SODIUM CHLORIDE 0.9 % IV BOLUS
1000.0000 mL | Freq: Once | INTRAVENOUS | Status: AC
  Administered 2023-10-13: 1000 mL via INTRAVENOUS

## 2023-10-12 NOTE — ED Provider Notes (Signed)
 [place] EMERGENCY DEPARTMENT AT [facility] Provider Note   CSN: [id number] Arrival date & time: 10/12/23  2320     Patient presents with: Heat Exposure   Nicolas Fuller is a 58 y.o. male.  {Add pertinent medical, surgical, social history, OB history to YEP:67052} The history is provided by the spouse and the EMS personnel. The history is limited by the condition of the patient (Altered mental status).  He had been at a concert and apparently had had some alcohol tonight.  He got up to walk to the bathroom but fell down a hill with questionable injury to the head.  He was found at the bottom of the hill with altered mental status.  EMS noted initial hypotension with blood pressure 80/42 and initial hypoxia with oxygen saturation of 80% on room air.  He was placed on a nonrebreather mask and given IV fluids with improvement of blood pressure and oxygen saturation.  His wife states that his speech was slightly slurred and slow prior to going to the concert.  As far as she knows, he he is not using drugs currently, but they are separated.   Prior to Admission medications   Medication Sig Start Date End Date Taking? Authorizing Provider  ASPIRIN  LOW DOSE 81 MG tablet Take 81 mg by mouth daily. 08/18/22   [provider]  losartan (COZAAR) 50 MG tablet Take 50 mg by mouth daily. 07/20/22   [provider]  metoprolol succinate (TOPROL-XL) 25 MG 24 hr tablet Take 25 mg by mouth daily. 08/18/22   [provider]  nitroGLYCERIN  (NITROSTAT ) 0.4 MG SL tablet Place 1 tablet (0.4 mg total) under the tongue every 5 (five) minutes as needed for chest pain. 08/25/22   Okey Vina GAILS, MD  testosterone  cypionate (DEPOTESTOSTERONE CYPIONATE) 200 MG/ML injection INJECT 1 ML INTO DEEP IM EVERY 14 DAYS 10/21/14   [provider]    Allergies: Patient has no known allergies.    Review of Systems  Unable to perform ROS: Mental status change    Updated Vital  Signs There were no vitals taken for this visit.  Physical Exam Vitals and nursing note reviewed.   58 year old male, resting comfortably and in no acute distress. Vital signs are normal. Oxygen saturation is 100% on room air, which is normal. Head is normocephalic and atraumatic. PERRLA, EOMI. Oropharynx is clear. Neck is immobilized in a stiff cervical collar and is nontender. Back is nontender and there is no CVA tenderness. Lungs are clear without rales, wheezes, or rhonchi. Chest is nontender. Heart has regular rate and rhythm without murmur. Abdomen is soft, flat, nontender. Extremities have no cyanosis or edema, full range of motion is present. Skin is warm and dry without rash. Neurologic: Awake but slow to respond to questions.  Oriented to person and time but not place.  He does follow commands, but again, is slow to respond to command requests.  Moves all extremities equally.  (all labs ordered are listed, but only abnormal results are displayed) Labs Reviewed - No data to display  EKG: None  Radiology: No results found.  {Document cardiac monitor, telemetry assessment procedure when appropriate:32947} Procedures   Medications Ordered in the ED - No data to display    {Click here for ABCD2, HEART and other calculators REFRESH Note before signing:1}  Medical Decision Making  Altered mental status inpatient with alcohol consumption and possible head injury.  This is a presentation with a wide range of treatment options and carries with it a significant risk of morbidity and complications.  Differential diagnosis includes alcohol intoxication, heatstroke, other drug intoxication, occult infection, metabolic encephalopathy.  Report of initial hypotension and hypoxia which have resolved.  His temperature is normal here-no evidence of heatstroke.  I have ordered laboratory workup including ethanol level and ordered CT of head.  I have reviewed his  past records, and note cardiac catheterization on 08/26/2022 showing no coronary artery disease.  No prior visits for similar presentation.  {Document critical care time when appropriate  Document review of labs and clinical decision tools ie CHADS2VASC2, etc  Document your independent review of radiology images and any outside records  Document your discussion with family members, caretakers and with consultants  Document social determinants of health affecting pt's care  Document your decision making why or why not admission, treatments were needed:32947:::1}   Final diagnoses:  None    ED Discharge Orders     None

## 2023-10-12 NOTE — ED Triage Notes (Addendum)
 [facility]. Pts wife found pt down at the bottom of a hill outside. Unknown downtime. On EMS arrival pt was 80% on room air and Bp 80/42. 95 % on NRB on arrival and BP 126/72 after 500 ml NaCl. Pt reports have 2 shots of liquor tonight. Pt is very hot to touch; EMS applied ice packs to pts groin and neck.

## 2023-10-13 ENCOUNTER — Other Ambulatory Visit: Payer: Self-pay

## 2023-10-13 ENCOUNTER — Emergency Department (HOSPITAL_COMMUNITY)

## 2023-10-13 DIAGNOSIS — M542 Cervicalgia: Secondary | ICD-10-CM | POA: Diagnosis not present

## 2023-10-13 DIAGNOSIS — G44309 Post-traumatic headache, unspecified, not intractable: Secondary | ICD-10-CM | POA: Diagnosis not present

## 2023-10-13 LAB — CBC WITH DIFFERENTIAL/PLATELET
Abs Immature Granulocytes: 0.02 K/uL (ref 0.00–0.07)
Basophils Absolute: 0.1 K/uL (ref 0.0–0.1)
Basophils Relative: 1 %
Eosinophils Absolute: 0.1 K/uL (ref 0.0–0.5)
Eosinophils Relative: 1 %
HCT: 48.7 % (ref 39.0–52.0)
Hemoglobin: 16.6 g/dL (ref 13.0–17.0)
Immature Granulocytes: 0 %
Lymphocytes Relative: 33 %
Lymphs Abs: 2.6 K/uL (ref 0.7–4.0)
MCH: 33.6 pg (ref 26.0–34.0)
MCHC: 34.1 g/dL (ref 30.0–36.0)
MCV: 98.6 fL (ref 80.0–100.0)
Monocytes Absolute: 0.7 K/uL (ref 0.1–1.0)
Monocytes Relative: 9 %
Neutro Abs: 4.5 K/uL (ref 1.7–7.7)
Neutrophils Relative %: 56 %
Platelets: 385 K/uL (ref 150–400)
RBC: 4.94 MIL/uL (ref 4.22–5.81)
RDW: 12.4 % (ref 11.5–15.5)
WBC: 7.9 K/uL (ref 4.0–10.5)
nRBC: 0 % (ref 0.0–0.2)

## 2023-10-13 LAB — COMPREHENSIVE METABOLIC PANEL WITH GFR
ALT: 32 U/L (ref 0–44)
AST: 25 U/L (ref 15–41)
Albumin: 3.4 g/dL — ABNORMAL LOW (ref 3.5–5.0)
Alkaline Phosphatase: 48 U/L (ref 38–126)
Anion gap: 13 (ref 5–15)
BUN: 9 mg/dL (ref 6–20)
CO2: 19 mmol/L — ABNORMAL LOW (ref 22–32)
Calcium: 8.5 mg/dL — ABNORMAL LOW (ref 8.9–10.3)
Chloride: 106 mmol/L (ref 98–111)
Creatinine, Ser: 1.03 mg/dL (ref 0.61–1.24)
GFR, Estimated: >60 mL/min (ref >60)
Glucose, Bld: 92 mg/dL (ref 70–99)
Potassium: 3.9 mmol/L (ref 3.5–5.1)
Sodium: 138 mmol/L (ref 135–145)
Total Bilirubin: 0.5 mg/dL (ref 0.0–1.2)
Total Protein: 6.9 g/dL (ref 6.5–8.1)

## 2023-10-13 LAB — URINALYSIS, W/ REFLEX TO CULTURE (INFECTION SUSPECTED)
Bacteria, UA: NONE SEEN
Bilirubin Urine: NEGATIVE
Glucose, UA: NEGATIVE mg/dL
Hgb urine dipstick: NEGATIVE
Ketones, ur: NEGATIVE mg/dL
Leukocytes,Ua: NEGATIVE
Nitrite: NEGATIVE
Protein, ur: NEGATIVE mg/dL
Specific Gravity, Urine: 1.003 — ABNORMAL LOW (ref 1.005–1.030)
pH: 5 (ref 5.0–8.0)

## 2023-10-13 LAB — RAPID URINE DRUG SCREEN, HOSP PERFORMED
Amphetamines: NOT DETECTED
Barbiturates: NOT DETECTED
Benzodiazepines: NOT DETECTED
Cocaine: POSITIVE — AB
Opiates: NOT DETECTED
Tetrahydrocannabinol: NOT DETECTED

## 2023-10-13 LAB — I-STAT CG4 LACTIC ACID, ED: Lactic Acid, Venous: 1.9 mmol/L (ref 0.5–1.9)

## 2023-10-13 LAB — ETHANOL: Alcohol, Ethyl (B): 236 mg/dL — ABNORMAL HIGH (ref <15)

## 2023-10-13 MED ORDER — LORAZEPAM 2 MG/ML IJ SOLN
1.0000 mg | Freq: Once | INTRAMUSCULAR | Status: AC
  Administered 2023-10-13: 1 mg via INTRAVENOUS
  Filled 2023-10-13: qty 1

## 2023-10-13 NOTE — ED Notes (Signed)
 Pt able to walk in the hallway with minimal to no assistance. Pt reported no new pain, dizziness, or weakness. Pt asking to go home now. RN notified.

## 2023-10-13 NOTE — Discharge Instructions (Signed)
 You had way too much alcohol to drink tonight.  It would be safest if you did not consume alcohol at all.  Your drug screen was also positive for cocaine, it is very dangerous for you to use cocaine and you should not use it at all.  I have included resources for centers to work with substance abuse if you need help with stopping.

## 2023-10-17 DIAGNOSIS — F4323 Adjustment disorder with mixed anxiety and depressed mood: Secondary | ICD-10-CM | POA: Diagnosis not present

## 2023-10-24 DIAGNOSIS — F4323 Adjustment disorder with mixed anxiety and depressed mood: Secondary | ICD-10-CM | POA: Diagnosis not present

## 2023-11-02 DIAGNOSIS — F4323 Adjustment disorder with mixed anxiety and depressed mood: Secondary | ICD-10-CM | POA: Diagnosis not present

## 2023-11-07 DIAGNOSIS — F4323 Adjustment disorder with mixed anxiety and depressed mood: Secondary | ICD-10-CM | POA: Diagnosis not present

## 2023-11-21 DIAGNOSIS — F4323 Adjustment disorder with mixed anxiety and depressed mood: Secondary | ICD-10-CM | POA: Diagnosis not present

## 2023-11-28 DIAGNOSIS — F4323 Adjustment disorder with mixed anxiety and depressed mood: Secondary | ICD-10-CM | POA: Diagnosis not present

## 2023-12-05 DIAGNOSIS — F4323 Adjustment disorder with mixed anxiety and depressed mood: Secondary | ICD-10-CM | POA: Diagnosis not present

## 2023-12-19 DIAGNOSIS — F4323 Adjustment disorder with mixed anxiety and depressed mood: Secondary | ICD-10-CM | POA: Diagnosis not present

## 2023-12-26 DIAGNOSIS — F4323 Adjustment disorder with mixed anxiety and depressed mood: Secondary | ICD-10-CM | POA: Diagnosis not present

## 2024-01-02 DIAGNOSIS — F4323 Adjustment disorder with mixed anxiety and depressed mood: Secondary | ICD-10-CM | POA: Diagnosis not present

## 2024-01-08 DIAGNOSIS — I10 Essential (primary) hypertension: Secondary | ICD-10-CM | POA: Diagnosis not present

## 2024-01-08 DIAGNOSIS — J029 Acute pharyngitis, unspecified: Secondary | ICD-10-CM | POA: Diagnosis not present

## 2024-01-08 DIAGNOSIS — B349 Viral infection, unspecified: Secondary | ICD-10-CM | POA: Diagnosis not present

## 2024-01-24 DIAGNOSIS — F4323 Adjustment disorder with mixed anxiety and depressed mood: Secondary | ICD-10-CM | POA: Diagnosis not present

## 2024-01-29 DIAGNOSIS — I10 Essential (primary) hypertension: Secondary | ICD-10-CM | POA: Diagnosis not present

## 2024-01-29 DIAGNOSIS — G4700 Insomnia, unspecified: Secondary | ICD-10-CM | POA: Diagnosis not present

## 2024-01-31 DIAGNOSIS — F4323 Adjustment disorder with mixed anxiety and depressed mood: Secondary | ICD-10-CM | POA: Diagnosis not present

## 2024-02-07 DIAGNOSIS — F4323 Adjustment disorder with mixed anxiety and depressed mood: Secondary | ICD-10-CM | POA: Diagnosis not present

## 2024-02-14 DIAGNOSIS — F4323 Adjustment disorder with mixed anxiety and depressed mood: Secondary | ICD-10-CM | POA: Diagnosis not present

## 2024-02-29 DIAGNOSIS — Z125 Encounter for screening for malignant neoplasm of prostate: Secondary | ICD-10-CM | POA: Diagnosis not present

## 2024-02-29 DIAGNOSIS — E785 Hyperlipidemia, unspecified: Secondary | ICD-10-CM | POA: Diagnosis not present

## 2024-02-29 DIAGNOSIS — G4700 Insomnia, unspecified: Secondary | ICD-10-CM | POA: Diagnosis not present

## 2024-02-29 DIAGNOSIS — E291 Testicular hypofunction: Secondary | ICD-10-CM | POA: Diagnosis not present

## 2024-02-29 DIAGNOSIS — I10 Essential (primary) hypertension: Secondary | ICD-10-CM | POA: Diagnosis not present

## 2024-02-29 DIAGNOSIS — Z79899 Other long term (current) drug therapy: Secondary | ICD-10-CM | POA: Diagnosis not present
